# Patient Record
Sex: Male | Born: 2000 | Race: Black or African American | Hispanic: No | Marital: Single | Smoking: Never smoker
Health system: Southern US, Community
[De-identification: ages and names within clinical notes are randomized; demographics above are authoritative.]

## PROBLEM LIST (undated history)

## (undated) DIAGNOSIS — D573 Sickle-cell trait: Secondary | ICD-10-CM

---
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
## 2019-01-30 ENCOUNTER — Other Ambulatory Visit: Payer: Self-pay

## 2019-01-30 ENCOUNTER — Ambulatory Visit
Admission: EM | Admit: 2019-01-30 | Discharge: 2019-01-30 | Disposition: A | Discharge status: Home / Self Care | Payer: Managed Care, Other (non HMO) | Attending: Physician Assistant | Admitting: Physician Assistant

## 2019-01-30 ENCOUNTER — Encounter: Payer: Self-pay | Admitting: Physician Assistant

## 2019-01-30 DIAGNOSIS — B349 Viral infection, unspecified: Secondary | ICD-10-CM

## 2019-01-30 MED ORDER — IPRATROPIUM BROMIDE 0.06 % NA SOLN: 2.0000 | Freq: Four times a day (QID) | NASAL | 12 refills | Status: AC

## 2019-01-30 MED ORDER — FLUTICASONE PROPIONATE 50 MCG/ACT NA SUSP: 2.0000 | Freq: Every day | NASAL | 0 refills | Status: AC

## 2019-01-30 MED ORDER — CETIRIZINE HCL 10 MG PO TABS: 10.0000 mg | ORAL_TABLET | Freq: Every day | ORAL | 0 refills | Status: AC

## 2019-01-30 NOTE — Discharge Instructions (Addendum)
Zyrtec as directed. Start flonase, atrovent nasal spray for nasal congestion/drainage. You can use over the counter nasal saline rinse such as neti pot for nasal congestion. Keep hydrated, your urine should be clear to pale yellow in color. Tylenol/motrin for fever and pain. If you start developing shortness of breath, please call the COVID hotline for further instructions.

## 2019-01-30 NOTE — ED Triage Notes (Addendum)
Per pt he has been having congestion, fatigue, chills, some coughing for 2 days. No fevers, no sob, no chest pain. Feeling very tired and no apatite.

## 2019-01-30 NOTE — ED Provider Notes (Signed)
[facility]    CSN: [id number] Arrival date & time: 01/30/19  1516     History   Chief Complaint Chief Complaint  Patient presents with  . Nasal Congestion  . Fatigue    HPI Benjamin Kramer is a 18 y.o. male.   18 year old male comes in for 2 day history of URI symptoms.  Started with sore throat, now with rhinorrhea, nasal congestion, sneezing as well.  He has had minimal cough.  Denies fever, chills, night sweats.  Has felt more tired with no appetite.  Denies trouble swallowing, trouble breathing, swelling of the throat, tripoding, drooling, trismus.  Denies shortness of breath, wheezing, chest pain.  No obvious sick contact.  Denies any travels.  Has not taken anything for the symptoms.     Past Medical History:  Diagnosis Date  . Sickle cell trait (HCC)     There are no active problems to display for this patient.   History reviewed. No pertinent surgical history.     Home Medications    Prior to Admission medications   Medication Sig Start Date End Date Taking? Authorizing Provider  cetirizine (ZYRTEC) 10 MG tablet Take 1 tablet (10 mg total) by mouth daily. 01/30/19   Cathie Hoops, Phu Record V, PA-C  fluticasone (FLONASE) 50 MCG/ACT nasal spray Place 2 sprays into both nostrils daily. 01/30/19   Cathie Hoops, Hildred Pharo V, PA-C  ipratropium (ATROVENT) 0.06 % nasal spray Place 2 sprays into both nostrils 4 (four) times daily. 01/30/19   Belinda Fisher, PA-C    Family History No family history on file.  Social History Social History   Tobacco Use  . Smoking status: Former Games developer  . Smokeless tobacco: Never Used  Substance Use Topics  . Alcohol use: No  . Drug use: Not on file     Allergies   Patient has no known allergies.   Review of Systems Review of Systems  Reason unable to perform ROS: See HPI as above.     Physical Exam Triage Vital Signs ED Triage Vitals  Enc Vitals Group     BP 01/30/19 1525 129/77     Pulse Rate 01/30/19 1525 63     Resp 01/30/19 1525 16     Temp 01/30/19 1525 97.6 F (36.4 C)     Temp Source 01/30/19 1525 Oral     SpO2 01/30/19 1525 98 %     Weight 01/30/19 1526 175 lb (79.4 kg)     Height 01/30/19 1526 5\' 6"  (1.676 m)     Head Circumference --      Peak Flow --      Pain Score 01/30/19 1526 0     Pain Loc --      Pain Edu? --      Excl. in GC? --    No data found.  Updated Vital Signs BP 129/77 (BP Location: Right Arm)   Pulse 63   Temp 97.6 F (36.4 C) (Oral)   Resp 16   Ht 5\' 6"  (1.676 m)   Wt 175 lb (79.4 kg)   SpO2 98%   BMI 28.25 kg/m    Physical Exam Constitutional:      General: He is not in acute distress.    Appearance: He is well-developed. He is not ill-appearing, toxic-appearing or diaphoretic.  HENT:     Head: Normocephalic and atraumatic.     Right Ear: Tympanic membrane, ear canal and external ear normal. Tympanic membrane is not erythematous or bulging.  Left Ear: Tympanic membrane, ear canal and external ear normal. Tympanic membrane is not erythematous or bulging.     Nose: Rhinorrhea present.     Right Sinus: No maxillary sinus tenderness or frontal sinus tenderness.     Left Sinus: No maxillary sinus tenderness or frontal sinus tenderness.     Mouth/Throat:     Mouth: Mucous membranes are moist.     Pharynx: Oropharynx is clear. Uvula midline.  Eyes:     Conjunctiva/sclera: Conjunctivae normal.     Pupils: Pupils are equal, round, and reactive to light.  Neck:     Musculoskeletal: Normal range of motion and neck supple.  Cardiovascular:     Rate and Rhythm: Normal rate and regular rhythm.     Heart sounds: Normal heart sounds. No murmur. No friction rub. No gallop.   Pulmonary:     Effort: Pulmonary effort is normal. No accessory muscle usage, prolonged expiration, respiratory distress or retractions.     Breath sounds: Normal breath sounds. No stridor, decreased air movement or transmitted upper airway sounds. No decreased breath sounds, wheezing, rhonchi or rales.  Skin:     General: Skin is warm and dry.  Neurological:     Mental Status: He is alert and oriented to person, place, and time.      UC Treatments / Results  Labs (all labs ordered are listed, but only abnormal results are displayed) Labs Reviewed - No data to display  EKG None  Radiology No results found.  Procedures Procedures (including critical care time)  Medications Ordered in UC Medications - No data to display  Initial Impression / Assessment and Plan / UC Course  I have reviewed the triage vital signs and the nursing notes.  Pertinent labs & imaging results that were available during my care of the patient were reviewed by me and considered in my medical decision making (see chart for details).    Discussed allergic rhinitis versus viral illness.  Discussed unable to do cover testing, lower suspicion given no travels, rhinorrhea/nasal congestion.  However, will provide self quarantine information, and have patient take precautions.  Symptomatic treatment discussed.  Push fluids.  Return precautions given.  Patient expresses understanding and agrees to plan.  Final Clinical Impressions(s) / UC Diagnoses   Final diagnoses:  Viral illness   ED Prescriptions    Medication Sig Dispense Auth. Provider   ipratropium (ATROVENT) 0.06 % nasal spray Place 2 sprays into both nostrils 4 (four) times daily. 15 mL Danni Leabo V, PA-C   fluticasone (FLONASE) 50 MCG/ACT nasal spray Place 2 sprays into both nostrils daily. 1 g Marysue Fait V, PA-C   cetirizine (ZYRTEC) 10 MG tablet Take 1 tablet (10 mg total) by mouth daily. 30 tablet Threasa Alpha, [place] 01/30/19 1547

## 2020-01-07 ENCOUNTER — Telehealth: Payer: Self-pay | Admitting: General Practice

## 2020-01-07 NOTE — Telephone Encounter (Signed)
Requesting to become a new patient of yours.  You see his mother Maryla Morrow. She thinks you would be great with him.   Please advise.

## 2020-01-08 NOTE — Telephone Encounter (Signed)
I can accept

## 2020-01-11 NOTE — Telephone Encounter (Signed)
Patient has made an appointment for 3/10. New patient paperwork has been mailed out.

## 2020-01-18 NOTE — Progress Notes (Signed)
Subjective:    Patient ID: Benjamin Kramer, male    DOB: 01/24/2001, 19 y.o.   MRN: [id number]  HPI  He is here to establish with a new pcp.   .   he is here for a physical exam.   He has no concerns or questions.  Medications and allergies reviewed with patient and updated if appropriate.  Patient Active Problem List   Diagnosis Date Noted  . Allergic rhinitis 01/19/2020    Current Outpatient Medications on File Prior to Visit  Medication Sig Dispense Refill  . Ascorbic Acid (VITAMIN C) 500 MG CAPS Take 500 mg by mouth.    . cetirizine (ZYRTEC) 10 MG tablet Take 1 tablet (10 mg total) by mouth daily. 30 tablet 0  . fluticasone (FLONASE) 50 MCG/ACT nasal spray Place 2 sprays into both nostrils daily. 1 g 0  . ipratropium (ATROVENT) 0.06 % nasal spray Place 2 sprays into both nostrils 4 (four) times daily. 15 mL 12   No current facility-administered medications on file prior to visit.    Past Medical History:  Diagnosis Date  . Sickle cell trait (HCC)     History reviewed. No pertinent surgical history.  Social History   Socioeconomic History  . Marital status: Single    Spouse name: Not on file  . Number of children: Not on file  . Years of education: Not on file  . Highest education level: Not on file  Occupational History  . Not on file  Tobacco Use  . Smoking status: Former Games developer  . Smokeless tobacco: Never Used  Substance and Sexual Activity  . Alcohol use: No  . Drug use: Never  . Sexual activity: Yes  Other Topics Concern  . Not on file  Social History Narrative  . Not on file   Social Determinants of Health   Financial Resource Strain:   . Difficulty of Paying Living Expenses: Not on file  Food Insecurity:   . Worried About Programme researcher, broadcasting/film/video in the Last Year: Not on file  . Ran Out of Food in the Last Year: Not on file  Transportation Needs:   . Lack of Transportation (Medical): Not on file  . Lack of Transportation (Non-Medical): Not on file    Physical Activity:   . Days of Exercise per Week: Not on file  . Minutes of Exercise per Session: Not on file  Stress:   . Feeling of Stress : Not on file  Social Connections:   . Frequency of Communication with Friends and Family: Not on file  . Frequency of Social Gatherings with Friends and Family: Not on file  . Attends Religious Services: Not on file  . Active Member of Clubs or Organizations: Not on file  . Attends Banker Meetings: Not on file  . Marital Status: Not on file    Family History  Problem Relation Age of Onset  . Hypertension Maternal Grandmother     Review of Systems  Constitutional: Negative for chills and fever.  Eyes: Negative for visual disturbance.  Respiratory: Negative for cough, shortness of breath and wheezing.   Cardiovascular: Negative for chest pain, palpitations and leg swelling.  Gastrointestinal: Negative for abdominal pain, blood in stool, constipation, diarrhea and nausea.  Genitourinary: Negative for difficulty urinating, discharge, dysuria and hematuria.  Musculoskeletal: Positive for back pain. Negative for arthralgias.  Skin: Negative for color change and rash.  Neurological: Negative for dizziness, light-headedness and headaches.  Psychiatric/Behavioral: Negative for  dysphoric mood. The patient is not nervous/anxious.        Objective:   Vitals:   01/19/20 1015  BP: 122/84  Pulse: 61  Resp: 16  Temp: 98.4 F (36.9 C)  SpO2: 99%   Filed Weights   01/19/20 1015  Weight: 187 lb (84.8 kg)   Body mass index is 30.18 kg/m.  BP Readings from Last 3 Encounters:  01/19/20 122/84  01/30/19 129/77  11/01/16 123/71 (72 %, Z = 0.60 /  64 %, Z = 0.36)*   *BP percentiles are based on the 2017 AAP Clinical Practice Guideline for boys    Wt Readings from Last 3 Encounters:  01/19/20 187 lb (84.8 kg)  01/30/19 175 lb (79.4 kg)  11/01/16 178 lb (80.7 kg) (86 %, Z= 1.06)*   * Growth percentiles are based on CDC  (Boys, 2-20 Years) data.     Physical Exam Constitutional: He appears well-developed and well-nourished. No distress.  HENT:  Head: Normocephalic and atraumatic.  Right Ear: External ear normal.  Left Ear: External ear normal.  Mouth/Throat: Oropharynx is clear and moist.  Normal ear canals and TM b/l  Eyes: Conjunctivae and EOM are normal.  Neck: Neck supple. No tracheal deviation present. No thyromegaly present.  No carotid bruit  Cardiovascular: Normal rate, regular rhythm, normal heart sounds and intact distal pulses.   No murmur heard. Pulmonary/Chest: Effort normal and breath sounds normal. No respiratory distress. He has no wheezes. He has no rales.  Abdominal: Soft. He exhibits no distension. There is no tenderness.  Genitourinary: deferred  Musculoskeletal: He exhibits no edema.  Lymphadenopathy:   He has no cervical adenopathy.  Skin: Skin is warm and dry. He is not diaphoretic.  Psychiatric: He has a normal mood and affect. His behavior is normal.          Assessment & Plan:   Physical exam: Screening blood work    deferred for today Immunizations tetanus due this year-given today Exercise works at [employer] and has a very physical job Weight normal Substance abuse none  See Problem List for Assessment and Plan of chronic medical problems.      This visit occurred during the SARS-CoV-2 public health emergency.  Safety protocols were in place, including screening questions prior to the visit, additional usage of staff PPE, and extensive cleaning of exam room while observing appropriate contact time as indicated for disinfecting solutions.

## 2020-01-18 NOTE — Patient Instructions (Addendum)
All other Health Maintenance issues reviewed.   All recommended immunizations and age-appropriate screenings are up-to-date or discussed.  Tetanus immunization administered today.   Medications reviewed and updated.  Changes include :   none    Please followup in 1 year    Health Maintenance, Male Adopting a healthy lifestyle and getting preventive care are important in promoting health and wellness. Ask your health care provider about:  The right schedule for you to have regular tests and exams.  Things you can do on your own to prevent diseases and keep yourself healthy. What should I know about diet, weight, and exercise? Eat a healthy diet   Eat a diet that includes plenty of vegetables, fruits, low-fat dairy products, and lean protein.  Do not eat a lot of foods that are high in solid fats, added sugars, or sodium. Maintain a healthy weight Body mass index (BMI) is a measurement that can be used to identify possible weight problems. It estimates body fat based on height and weight. Your health care provider can help determine your BMI and help you achieve or maintain a healthy weight. Get regular exercise Get regular exercise. This is one of the most important things you can do for your health. Most adults should:  Exercise for at least 150 minutes each week. The exercise should increase your heart rate and make you sweat (moderate-intensity exercise).  Do strengthening exercises at least twice a week. This is in addition to the moderate-intensity exercise.  Spend less time sitting. Even light physical activity can be beneficial. Watch cholesterol and blood lipids Have your blood tested for lipids and cholesterol at 21 years of age, then have this test every 5 years. You may need to have your cholesterol levels checked more often if:  Your lipid or cholesterol levels are high.  You are older than 21 years of age.  You are at high risk for heart disease. What should  I know about cancer screening? Many types of cancers can be detected early and may often be prevented. Depending on your health history and family history, you may need to have cancer screening at various ages. This may include screening for:  Colorectal cancer.  Prostate cancer.  Skin cancer.  Lung cancer. What should I know about heart disease, diabetes, and high blood pressure? Blood pressure and heart disease  High blood pressure causes heart disease and increases the risk of stroke. This is more likely to develop in people who have high blood pressure readings, are of African descent, or are overweight.  Talk with your health care provider about your target blood pressure readings.  Have your blood pressure checked: ? Every 3-5 years if you are 31-3 years of age. ? Every year if you are 42 years old or older.  If you are between the ages of 65 and 18 and are a current or former smoker, ask your health care provider if you should have a one-time screening for abdominal aortic aneurysm (AAA). Diabetes Have regular diabetes screenings. This checks your fasting blood sugar level. Have the screening done:  Once every three years after age 85 if you are at a normal weight and have a low risk for diabetes.  More often and at a younger age if you are overweight or have a high risk for diabetes. What should I know about preventing infection? Hepatitis B If you have a higher risk for hepatitis B, you should be screened for this virus. Talk with your health care  provider to find out if you are at risk for hepatitis B infection. Hepatitis C Blood testing is recommended for:  Everyone born from [birth year] through 1965.  Anyone with known risk factors for hepatitis C. Sexually transmitted infections (STIs)  You should be screened each year for STIs, including gonorrhea and chlamydia, if: ? You are sexually active and are younger than 21 years of age. ? You are older than 21 years of age  and your health care provider tells you that you are at risk for this type of infection. ? Your sexual activity has changed since you were last screened, and you are at increased risk for chlamydia or gonorrhea. Ask your health care provider if you are at risk.  Ask your health care provider about whether you are at high risk for HIV. Your health care provider may recommend a prescription medicine to help prevent HIV infection. If you choose to take medicine to prevent HIV, you should first get tested for HIV. You should then be tested every 3 months for as long as you are taking the medicine. Follow these instructions at home: Lifestyle  Do not use any products that contain nicotine or tobacco, such as cigarettes, e-cigarettes, and chewing tobacco. If you need help quitting, ask your health care provider.  Do not use street drugs.  Do not share needles.  Ask your health care provider for help if you need support or information about quitting drugs. Alcohol use  Do not drink alcohol if your health care provider tells you not to drink.  If you drink alcohol: ? Limit how much you have to 0-2 drinks a day. ? Be aware of how much alcohol is in your drink. In the [place] one drink equals one 12 oz bottle of beer (355 mL), one 5 oz glass of wine (148 mL), or one 1 oz glass of hard liquor (44 mL). General instructions  Schedule regular health, dental, and eye exams.  Stay current with your vaccines.  Tell your health care provider if: ? You often feel depressed. ? You have ever been abused or do not feel safe at home. Summary  Adopting a healthy lifestyle and getting preventive care are important in promoting health and wellness.  Follow your health care provider's instructions about healthy diet, exercising, and getting tested or screened for diseases.  Follow your health care provider's instructions on monitoring your cholesterol and blood pressure. This information is not intended to  replace advice given to you by your health care provider. Make sure you discuss any questions you have with your health care provider. Document Revised: 10/21/2018 Document Reviewed: 10/21/2018 Elsevier Patient Education  2020 [employer].

## 2020-01-19 ENCOUNTER — Other Ambulatory Visit: Payer: Self-pay

## 2020-01-19 ENCOUNTER — Encounter: Payer: Self-pay | Admitting: Internal Medicine

## 2020-01-19 ENCOUNTER — Ambulatory Visit: Payer: Managed Care, Other (non HMO) | Admitting: Internal Medicine

## 2020-01-19 VITALS — BP 122/84 | HR 61 | Temp 98.4°F | Resp 16 | Ht 66.0 in | Wt 187.0 lb

## 2020-01-19 DIAGNOSIS — Z0000 Encounter for general adult medical examination without abnormal findings: Secondary | ICD-10-CM | POA: Diagnosis not present

## 2020-01-19 DIAGNOSIS — J302 Other seasonal allergic rhinitis: Secondary | ICD-10-CM

## 2020-01-19 DIAGNOSIS — Z23 Encounter for immunization: Secondary | ICD-10-CM

## 2020-01-19 DIAGNOSIS — J309 Allergic rhinitis, unspecified: Secondary | ICD-10-CM | POA: Insufficient documentation

## 2020-01-19 NOTE — Assessment & Plan Note (Signed)
Seasonal Mild Takes medication-over-the-counter, only as needed Controlled

## 2020-01-19 NOTE — Addendum Note (Signed)
Addended by: Mercer Pod E on: 01/19/2020 01:37 PM   Modules accepted: Orders

## 2023-03-26 ENCOUNTER — Ambulatory Visit (INDEPENDENT_AMBULATORY_CARE_PROVIDER_SITE_OTHER): Payer: Managed Care, Other (non HMO)

## 2023-03-26 ENCOUNTER — Ambulatory Visit
Admission: EM | Admit: 2023-03-26 | Discharge: 2023-03-26 | Disposition: A | Discharge status: Home / Self Care | Payer: Managed Care, Other (non HMO) | Attending: Urgent Care | Admitting: Urgent Care

## 2023-03-26 DIAGNOSIS — S60052A Contusion of left little finger without damage to nail, initial encounter: Secondary | ICD-10-CM

## 2023-03-26 MED ORDER — IBUPROFEN 600 MG PO TABS: 600.0000 mg | ORAL_TABLET | Freq: Four times a day (QID) | ORAL | 0 refills | Status: AC | PRN

## 2023-03-26 NOTE — ED Triage Notes (Signed)
Pt reports injury to left pinky finger while boxing ~1.5 months ago-has not sought medical tx PTA-NAD-steady gait

## 2023-03-26 NOTE — ED Provider Notes (Signed)
[name]  Note:  This document was prepared using Conservation officer, historic buildings and may include unintentional dictation errors.  MRN: [id number] DOB: 2001-01-26  Subjective:   Benjamin Kramer is a 22 y.o. male presenting for 1.67-month history of persistent left pinky finger and left hand pain.  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  Has continued to do his training.  No bruising, bony deformity.  However he does feel like there is swelling at the left fifth knuckle.  No current facility-administered medications for this encounter.  Current Outpatient Medications:    Ascorbic Acid (VITAMIN C) 500 MG CAPS, Take 500 mg by mouth., Disp: , Rfl:    cetirizine (ZYRTEC) 10 MG tablet, Take 1 tablet (10 mg total) by mouth daily., Disp: 30 tablet, Rfl: 0   fluticasone (FLONASE) 50 MCG/ACT nasal spray, Place 2 sprays into both nostrils daily., Disp: 1 g, Rfl: 0   ipratropium (ATROVENT) 0.06 % nasal spray, Place 2 sprays into both nostrils 4 (four) times daily., Disp: 15 mL, Rfl: 12   No Known Allergies  Past Medical History:  Diagnosis Date   Sickle cell trait (HCC)      History reviewed. No pertinent surgical history.  Family History  Problem Relation Age of Onset   Hypertension Maternal Grandmother     Social History   Tobacco Use   Smoking status: Never   Smokeless tobacco: Never  Vaping Use   Vaping Use: Never used  Substance Use Topics   Alcohol use: Yes    Comment: occ   Drug use: Yes    Types: Marijuana    ROS   Objective:   Vitals: BP (!) 146/80 (BP Location: Right Arm)   Pulse 72   Temp 98.9 F (37.2 C) (Oral)   Resp 16   SpO2 96%   Physical Exam Constitutional:      General: He is not in acute distress.    Appearance: Normal appearance. He is well-developed and normal weight. He is not ill-appearing, toxic-appearing or diaphoretic.  HENT:     Head: Normocephalic and atraumatic.     Right Ear:  External ear normal.     Left Ear: External ear normal.     Nose: Nose normal.     Mouth/Throat:     Pharynx: Oropharynx is clear.  Eyes:     General: No scleral icterus.       Right eye: No discharge.        Left eye: No discharge.     Extraocular Movements: Extraocular movements intact.  Cardiovascular:     Rate and Rhythm: Normal rate.  Pulmonary:     Effort: Pulmonary effort is normal.  Musculoskeletal:       Hands:     Cervical back: Normal range of motion.  Neurological:     Mental Status: He is alert and oriented to person, place, and time.  Psychiatric:        Mood and Affect: Mood normal.        Behavior: Behavior normal.        Thought Content: Thought content normal.        Judgment: Judgment normal.    DG Hand Complete Left  Result Date: 03/26/2023 CLINICAL DATA:  Pain EXAM: LEFT HAND - COMPLETE 3+ VIEW COMPARISON:  None Available. FINDINGS: No fracture or dislocation is seen. There are no focal lytic lesions. Small bony spurs are noted in the intercarpal joints  along the lateral aspect of the wrist. There are no abnormal soft tissue calcifications or opaque foreign bodies. IMPRESSION: No recent fracture or dislocation is seen in left hand. Small bony spurs are noted in the intercarpal joints along the lateral aspect of left wrist. Electronically Signed   By: Ernie Avena M.D.   On: 03/26/2023 19:42     Assessment and Plan :   PDMP not reviewed this encounter.  1. Contusion of left little finger without damage to nail, initial encounter    Recommended conservative management for left fifth finger contusion.  Use buddy tape system, rest, ibuprofen for pain and inflammation. Counseled patient on potential for adverse effects with medications prescribed/recommended today, ER and return-to-clinic precautions discussed, patient verbalized understanding.    Wallis Bamberg, [place] 03/27/23 [phone]

## 2023-06-18 ENCOUNTER — Encounter: Payer: Self-pay | Admitting: Internal Medicine

## 2023-06-18 NOTE — Progress Notes (Unsigned)
Subjective:    Patient ID: Benjamin Kramer, male    DOB: July 13, 2001, 22 y.o.   MRN: [id number]      HPI Benjamin Kramer is here for a Physical exam and her chronic medical problems.      Medications and allergies reviewed with patient and updated if appropriate.  Current Outpatient Medications on File Prior to Visit  Medication Sig Dispense Refill   Ascorbic Acid (VITAMIN C) 500 MG CAPS Take 500 mg by mouth.     cetirizine (ZYRTEC) 10 MG tablet Take 1 tablet (10 mg total) by mouth daily. 30 tablet 0   fluticasone (FLONASE) 50 MCG/ACT nasal spray Place 2 sprays into both nostrils daily. 1 g 0   ibuprofen (ADVIL) 600 MG tablet Take 1 tablet (600 mg total) by mouth every 6 (six) hours as needed. 30 tablet 0   ipratropium (ATROVENT) 0.06 % nasal spray Place 2 sprays into both nostrils 4 (four) times daily. 15 mL 12   No current facility-administered medications on file prior to visit.    Review of Systems     Objective:  There were no vitals filed for this visit. There were no vitals filed for this visit. There is no height or weight on file to calculate BMI.  BP Readings from Last 3 Encounters:  03/26/23 (!) 146/80  01/19/20 122/84  01/30/19 129/77    Wt Readings from Last 3 Encounters:  01/19/20 187 lb (84.8 kg)  01/30/19 175 lb (79.4 kg)  11/01/16 178 lb (80.7 kg) (86%, Z= 1.06)*   * Growth percentiles are based on CDC (Boys, 2-20 Years) data.       Physical Exam Constitutional: She appears well-developed and well-nourished. No distress.  HENT:  Head: Normocephalic and atraumatic.  Right Ear: External ear normal. Normal ear canal and TM Left Ear: External ear normal.  Normal ear canal and TM Mouth/Throat: Oropharynx is clear and moist.  Eyes: Conjunctivae normal.  Neck: Neck supple. No tracheal deviation present. No thyromegaly present.  No carotid bruit  Cardiovascular: Normal rate, regular rhythm and normal heart sounds.   No murmur heard.  No  edema. Pulmonary/Chest: Effort normal and breath sounds normal. No respiratory distress. She has no wheezes. She has no rales.  Breast: deferred   Abdominal: Soft. She exhibits no distension. There is no tenderness.  Lymphadenopathy: She has no cervical adenopathy.  Skin: Skin is warm and dry. She is not diaphoretic.  Psychiatric: She has a normal mood and affect. Her behavior is normal.     Lab Results  Component Value Date   WBC 3.6 (L) 11/01/2016   HGB 15.6 11/01/2016   HCT 42.4 11/01/2016   PLT 201 11/01/2016   GLUCOSE 95 11/01/2016   ALT 15 (L) 11/01/2016   AST 20 11/01/2016   NA 138 11/01/2016   K 3.9 11/01/2016   CL 102 11/01/2016   CREATININE 1.39 (H) 11/01/2016   BUN 12 11/01/2016   CO2 28 11/01/2016         Assessment & Plan:   Physical exam: Screening blood work  ordered Exercise   Weight   Substance abuse  none   Reviewed recommended immunizations.   Health Maintenance  Topic Date Due   HPV VACCINES (1 - Male 3-dose series) Never done   HIV Screening  Never done   Hepatitis C Screening  Never done   COVID-19 Vaccine (1 - 2023-24 season) Never done   INFLUENZA VACCINE  06/12/2023   DTaP/Tdap/Td (3 - Td  or Tdap) 01/18/2030          See Problem List for Assessment and Plan of chronic medical problems.

## 2023-06-18 NOTE — Patient Instructions (Addendum)
Look to see if you had the HPV vaccine    Blood work was ordered.   The lab is on the first floor.    Medications changes include :   none     Return in about 1 year (around 06/18/2024) for Physical Exam.    Health Maintenance, Male Adopting a healthy lifestyle and getting preventive care are important in promoting health and wellness. Ask your health care provider about: The right schedule for you to have regular tests and exams. Things you can do on your own to prevent diseases and keep yourself healthy. What should I know about diet, weight, and exercise? Eat a healthy diet  Eat a diet that includes plenty of vegetables, fruits, low-fat dairy products, and lean protein. Do not eat a lot of foods that are high in solid fats, added sugars, or sodium. Maintain a healthy weight Body mass index (BMI) is used to identify weight problems. It estimates body fat based on height and weight. Your health care provider can help determine your BMI and help you achieve or maintain a healthy weight. Get regular exercise Get regular exercise. This is one of the most important things you can do for your health. Most adults should: Exercise for at least 150 minutes each week. The exercise should increase your heart rate and make you sweat (moderate-intensity exercise). Do strengthening exercises at least twice a week. This is in addition to the moderate-intensity exercise. Spend less time sitting. Even light physical activity can be beneficial. Watch cholesterol and blood lipids Have your blood tested for lipids and cholesterol at 24 years of age, then have this test every 5 years. Have your cholesterol levels checked more often if: Your lipid or cholesterol levels are high. You are older than 24 years of age. You are at high risk for heart disease. What should I know about cancer screening? Depending on your health history and family history, you may need to have cancer screening at  various ages. This may include screening for: Breast cancer. Cervical cancer. Colorectal cancer. Skin cancer. Lung cancer. What should I know about heart disease, diabetes, and high blood pressure? Blood pressure and heart disease High blood pressure causes heart disease and increases the risk of stroke. This is more likely to develop in people who have high blood pressure readings or are overweight. Have your blood pressure checked: Every 3-5 years if you are 75-34 years of age. Every year if you are 40 years old or older. Diabetes Have regular diabetes screenings. This checks your fasting blood sugar level. Have the screening done: Once every three years after age 80 if you are at a normal weight and have a low risk for diabetes. More often and at a younger age if you are overweight or have a high risk for diabetes. What should I know about preventing infection? Hepatitis B If you have a higher risk for hepatitis B, you should be screened for this virus. Talk with your health care provider to find out if you are at risk for hepatitis B infection. Hepatitis C Testing is recommended for: Everyone born from [birth year] through 1965. Anyone with known risk factors for hepatitis C. Sexually transmitted infections (STIs) Get screened for STIs, including gonorrhea and chlamydia, if: You are sexually active and are younger than 24 years of age. You are older than 24 years of age and your health care provider tells you that you are at risk for this type of infection. Your sexual activity has  changed since you were last screened, and you are at increased risk for chlamydia or gonorrhea. Ask your health care provider if you are at risk. Ask your health care provider about whether you are at high risk for HIV. Your health care provider may recommend a prescription medicine to help prevent HIV infection. If you choose to take medicine to prevent HIV, you should first get tested for HIV. You should then be  tested every 3 months for as long as you are taking the medicine. Pregnancy If you are about to stop having your period (premenopausal) and you may become pregnant, seek counseling before you get pregnant. Take 400 to 800 micrograms (mcg) of folic acid every day if you become pregnant. Ask for birth control (contraception) if you want to prevent pregnancy. Osteoporosis and menopause Osteoporosis is a disease in which the bones lose minerals and strength with aging. This can result in bone fractures. If you are 12 years old or older, or if you are at risk for osteoporosis and fractures, ask your health care provider if you should: Be screened for bone loss. Take a calcium or vitamin D supplement to lower your risk of fractures. Be given hormone replacement therapy (HRT) to treat symptoms of menopause. Follow these instructions at home: Alcohol use Do not drink alcohol if: Your health care provider tells you not to drink. You are pregnant, may be pregnant, or are planning to become pregnant. If you drink alcohol: Limit how much you have to: 0-1 drink a day. Know how much alcohol is in your drink. In the [place] one drink equals one 12 oz bottle of beer (355 mL), one 5 oz glass of wine (148 mL), or one 1 oz glass of hard liquor (44 mL). Lifestyle Do not use any products that contain nicotine or tobacco. These products include cigarettes, chewing tobacco, and vaping devices, such as e-cigarettes. If you need help quitting, ask your health care provider. Do not use street drugs. Do not share needles. Ask your health care provider for help if you need support or information about quitting drugs. General instructions Schedule regular health, dental, and eye exams. Stay current with your vaccines. Tell your health care provider if: You often feel depressed. You have ever been abused or do not feel safe at home. Summary Adopting a healthy lifestyle and getting preventive care are important in  promoting health and wellness. Follow your health care provider's instructions about healthy diet, exercising, and getting tested or screened for diseases. Follow your health care provider's instructions on monitoring your cholesterol and blood pressure. This information is not intended to replace advice given to you by your health care provider. Make sure you discuss any questions you have with your health care provider. Document Revised: 03/19/2021 Document Reviewed: 03/19/2021 Elsevier Patient Education  2024 [employer].

## 2023-06-19 ENCOUNTER — Ambulatory Visit (INDEPENDENT_AMBULATORY_CARE_PROVIDER_SITE_OTHER): Payer: Managed Care, Other (non HMO) | Admitting: Internal Medicine

## 2023-06-19 VITALS — BP 110/60 | HR 56 | Temp 98.6°F | Ht 66.0 in | Wt 175.0 lb

## 2023-06-19 DIAGNOSIS — J302 Other seasonal allergic rhinitis: Secondary | ICD-10-CM | POA: Diagnosis not present

## 2023-06-19 DIAGNOSIS — Z113 Encounter for screening for infections with a predominantly sexual mode of transmission: Secondary | ICD-10-CM

## 2023-06-19 DIAGNOSIS — Z0000 Encounter for general adult medical examination without abnormal findings: Secondary | ICD-10-CM | POA: Diagnosis not present

## 2023-06-19 DIAGNOSIS — D72819 Decreased white blood cell count, unspecified: Secondary | ICD-10-CM

## 2023-06-19 NOTE — Progress Notes (Signed)
Subjective:    Patient ID: Benjamin Kramer, male    DOB: 23-Oct-2001, 22 y.o.   MRN: [id number]     HPI Benjamin Kramer is here for a physical exam and his chronic medical problems.   Occ severe left abdominal muscle spasms - usually when straining core.  Cannot occur during the intense workout or just afterward or doing something where he is straining his abdomen muscles.  He has not had this for a few weeks.  One of his pinky toes - since he was a kids - ? From wearing cleats.    Medications and allergies reviewed with patient and updated if appropriate.  Current Outpatient Medications on File Prior to Visit  Medication Sig Dispense Refill   Ascorbic Acid (VITAMIN C) 500 MG CAPS Take 500 mg by mouth.     cetirizine (ZYRTEC) 10 MG tablet Take 1 tablet (10 mg total) by mouth daily. 30 tablet 0   fluticasone (FLONASE) 50 MCG/ACT nasal spray Place 2 sprays into both nostrils daily. 1 g 0   ibuprofen (ADVIL) 600 MG tablet Take 1 tablet (600 mg total) by mouth every 6 (six) hours as needed. 30 tablet 0   ipratropium (ATROVENT) 0.06 % nasal spray Place 2 sprays into both nostrils 4 (four) times daily. 15 mL 12   No current facility-administered medications on file prior to visit.    Review of Systems  Constitutional:  Negative for fever.  Eyes:  Negative for visual disturbance.  Respiratory:  Negative for cough, shortness of breath and wheezing.   Cardiovascular:  Negative for chest pain, palpitations and leg swelling.  Gastrointestinal:  Negative for abdominal pain, blood in stool, constipation and diarrhea.       No gerd  Genitourinary:  Negative for difficulty urinating and dysuria.  Musculoskeletal:  Negative for arthralgias and back pain.  Skin:  Negative for rash.  Neurological:  Negative for light-headedness and headaches.  Psychiatric/Behavioral:  Negative for dysphoric mood. The patient is not nervous/anxious.        Objective:   Vitals:   06/19/23 0913  BP: 110/60  Pulse:  (!) 56  Temp: 98.6 F (37 C)  SpO2: 99%   Filed Weights   06/19/23 0913  Weight: 175 lb (79.4 kg)   Body mass index is 28.25 kg/m.  BP Readings from Last 3 Encounters:  06/19/23 110/60  03/26/23 (!) 146/80  01/19/20 122/84    Wt Readings from Last 3 Encounters:  06/19/23 175 lb (79.4 kg)  01/19/20 187 lb (84.8 kg)  01/30/19 175 lb (79.4 kg)      Physical Exam Constitutional: He appears well-developed and well-nourished. No distress.  HENT:  Head: Normocephalic and atraumatic.  Right Ear: External ear normal.  Left Ear: External ear normal.  Normal ear canals and TM b/l  Mouth/Throat: Oropharynx is clear and moist. Eyes: Conjunctivae and EOM are normal.  Neck: Neck supple. No tracheal deviation present. No thyromegaly present.  No carotid bruit  Cardiovascular: Normal rate, regular rhythm, normal heart sounds and intact distal pulses.   No murmur heard.  No lower extremity edema. Pulmonary/Chest: Effort normal and breath sounds normal. No respiratory distress. He has no wheezes. He has no rales.  Abdominal: Soft. He exhibits no distension. There is no tenderness.  Genitourinary: deferred  Lymphadenopathy:   He has no cervical adenopathy.  Skin: Skin is warm and dry. He is not diaphoretic.  Psychiatric: He has a normal mood and affect. His behavior is normal.  Assessment & Plan:   Physical exam: Screening blood work  ordered Exercise   regular - gym, boxing Weight  good for build - muscular Substance abuse   none   Reviewed recommended immunizations.   Health Maintenance  Topic Date Due   HPV VACCINES (1 - Male 3-dose series) Never done   HIV Screening  Never done   Hepatitis C Screening  Never done   COVID-19 Vaccine (1 - 2023-24 season) Never done   INFLUENZA VACCINE  02/09/2024 (Originally 06/12/2023)   DTaP/Tdap/Td (3 - Td or Tdap) 01/18/2030   Will check to see if he had HPV vaccine with his mother  See Problem List for Assessment and  Plan of chronic medical problems.

## 2023-06-19 NOTE — Assessment & Plan Note (Signed)
Controlled Over-the-counter allergy medications as needed

## 2023-06-19 NOTE — Assessment & Plan Note (Signed)
Discussed screening for STDs-agreed Blood work ordered

## 2023-06-21 NOTE — Addendum Note (Signed)
Addended by: Pincus Sanes on: 06/21/2023 04:47 PM   Modules accepted: Orders

## 2023-06-26 ENCOUNTER — Other Ambulatory Visit: Payer: Managed Care, Other (non HMO)

## 2023-06-26 DIAGNOSIS — D72819 Decreased white blood cell count, unspecified: Secondary | ICD-10-CM | POA: Diagnosis not present

## 2023-06-26 LAB — CBC WITH DIFFERENTIAL/PLATELET
Basophils Absolute: 0 10*3/uL (ref 0.0–0.1)
Basophils Relative: 0.5 % (ref 0.0–3.0)
Eosinophils Absolute: 0 10*3/uL (ref 0.0–0.7)
Eosinophils Relative: 0.5 % (ref 0.0–5.0)
HCT: 46.6 % (ref 39.0–52.0)
Hemoglobin: 15.7 g/dL (ref 13.0–17.0)
Lymphocytes Relative: 36.9 % (ref 12.0–46.0)
Lymphs Abs: 1 10*3/uL (ref 0.7–4.0)
MCHC: 33.6 g/dL (ref 30.0–36.0)
MCV: 87.8 fl (ref 78.0–100.0)
Monocytes Absolute: 0.2 10*3/uL (ref 0.1–1.0)
Monocytes Relative: 8.7 % (ref 3.0–12.0)
Neutro Abs: 1.5 10*3/uL (ref 1.4–7.7)
Neutrophils Relative %: 53.4 % (ref 43.0–77.0)
Platelets: 174 10*3/uL (ref 150.0–400.0)
RBC: 5.31 Mil/uL (ref 4.22–5.81)
RDW: 13.5 % (ref 11.5–15.5)
WBC: 2.8 10*3/uL — ABNORMAL LOW (ref 4.0–10.5)

## 2023-08-28 ENCOUNTER — Ambulatory Visit: Payer: Managed Care, Other (non HMO) | Admitting: Podiatry

## 2024-11-22 ENCOUNTER — Encounter: Payer: Self-pay | Admitting: Internal Medicine

## 2024-11-22 NOTE — Patient Instructions (Addendum)
 "     Blood work was ordered.       Medications changes include :   None    A referral was ordered and someone will call you to schedule an appointment.     Return in about 1 year (around 11/23/2025) for Physical Exam.   Health Maintenance, Male Adopting a healthy lifestyle and getting preventive care are important in promoting health and wellness. Ask your health care provider about: The right schedule for you to have regular tests and exams. Things you can do on your own to prevent diseases and keep yourself healthy. What should I know about diet, weight, and exercise? Eat a healthy diet  Eat a diet that includes plenty of vegetables, fruits, low-fat dairy products, and lean protein. Do not eat a lot of foods that are high in solid fats, added sugars, or sodium. Maintain a healthy weight Body mass index (BMI) is a measurement that can be used to identify possible weight problems. It estimates body fat based on height and weight. Your health care provider can help determine your BMI and help you achieve or maintain a healthy weight. Get regular exercise Get regular exercise. This is one of the most important things you can do for your health. Most adults should: Exercise for at least 150 minutes each week. The exercise should increase your heart rate and make you sweat (moderate-intensity exercise). Do strengthening exercises at least twice a week. This is in addition to the moderate-intensity exercise. Spend less time sitting. Even light physical activity can be beneficial. Watch cholesterol and blood lipids Have your blood tested for lipids and cholesterol at 26 years of age, then have this test every 5 years. You may need to have your cholesterol levels checked more often if: Your lipid or cholesterol levels are high. You are older than 26 years of age. You are at high risk for heart disease. What should I know about cancer screening? Many types of cancers can be detected  early and may often be prevented. Depending on your health history and family history, you may need to have cancer screening at various ages. This may include screening for: Colorectal cancer. Prostate cancer. Skin cancer. Lung cancer. What should I know about heart disease, diabetes, and high blood pressure? Blood pressure and heart disease High blood pressure causes heart disease and increases the risk of stroke. This is more likely to develop in people who have high blood pressure readings or are overweight. Talk with your health care provider about your target blood pressure readings. Have your blood pressure checked: Every 3-5 years if you are 65-30 years of age. Every year if you are 65 years old or older. If you are between the ages of 73 and 32 and are a current or former smoker, ask your health care provider if you should have a one-time screening for abdominal aortic aneurysm (AAA). Diabetes Have regular diabetes screenings. This checks your fasting blood sugar level. Have the screening done: Once every three years after age 12 if you are at a normal weight and have a low risk for diabetes. More often and at a younger age if you are overweight or have a high risk for diabetes. What should I know about preventing infection? Hepatitis B If you have a higher risk for hepatitis B, you should be screened for this virus. Talk with your health care provider to find out if you are at risk for hepatitis B infection. Hepatitis C Blood testing is recommended  for: Everyone born from [birth year] through 1967. Anyone with known risk factors for hepatitis C. Sexually transmitted infections (STIs) You should be screened each year for STIs, including gonorrhea and chlamydia, if: You are sexually active and are younger than 26 years of age. You are older than 26 years of age and your health care provider tells you that you are at risk for this type of infection. Your sexual activity has changed since  you were last screened, and you are at increased risk for chlamydia or gonorrhea. Ask your health care provider if you are at risk. Ask your health care provider about whether you are at high risk for HIV. Your health care provider may recommend a prescription medicine to help prevent HIV infection. If you choose to take medicine to prevent HIV, you should first get tested for HIV. You should then be tested every 3 months for as long as you are taking the medicine. Follow these instructions at home: Alcohol use Do not drink alcohol if your health care provider tells you not to drink. If you drink alcohol: Limit how much you have to 0-2 drinks a day. Know how much alcohol is in your drink. In the [place] one drink equals one 12 oz bottle of beer (355 mL), one 5 oz glass of wine (148 mL), or one 1 oz glass of hard liquor (44 mL). Lifestyle Do not use any products that contain nicotine or tobacco. These products include cigarettes, chewing tobacco, and vaping devices, such as e-cigarettes. If you need help quitting, ask your health care provider. Do not use street drugs. Do not share needles. Ask your health care provider for help if you need support or information about quitting drugs. General instructions Schedule regular health, dental, and eye exams. Stay current with your vaccines. Tell your health care provider if: You often feel depressed. You have ever been abused or do not feel safe at home. Summary Adopting a healthy lifestyle and getting preventive care are important in promoting health and wellness. Follow your health care provider's instructions about healthy diet, exercising, and getting tested or screened for diseases. Follow your health care provider's instructions on monitoring your cholesterol and blood pressure. This information is not intended to replace advice given to you by your health care provider. Make sure you discuss any questions you have with your health care  provider. Document Revised: 03/19/2021 Document Reviewed: 03/19/2021 Elsevier Patient Education  2024 [employer]. "

## 2024-11-22 NOTE — Progress Notes (Signed)
 "   Subjective:    Patient ID: Benjamin Kramer, male    DOB: [birth year], 24 y.o.   MRN: [id number]      HPI Benjamin Kramer is here for a Physical exam and her chronic medical problems.      Medications and allergies reviewed with patient and updated if appropriate.  Medications Ordered Prior to Encounter[1]  Review of Systems     Objective:  There were no vitals filed for this visit. There were no vitals filed for this visit. There is no height or weight on file to calculate BMI.  BP Readings from Last 3 Encounters:  06/19/23 110/60  03/26/23 (!) 146/80  01/19/20 122/84    Wt Readings from Last 3 Encounters:  06/19/23 175 lb (79.4 kg)  01/19/20 187 lb (84.8 kg)  01/30/19 175 lb (79.4 kg)       Physical Exam Constitutional: She appears well-developed and well-nourished. No distress.  HENT:  Head: Normocephalic and atraumatic.  Right Ear: External ear normal. Normal ear canal and TM Left Ear: External ear normal.  Normal ear canal and TM Mouth/Throat: Oropharynx is clear and moist.  Eyes: Conjunctivae normal.  Neck: Neck supple. No tracheal deviation present. No thyromegaly present.  No carotid bruit  Cardiovascular: Normal rate, regular rhythm and normal heart sounds.   No murmur heard.  No edema. Pulmonary/Chest: Effort normal and breath sounds normal. No respiratory distress. She has no wheezes. She has no rales.  Breast: deferred   Abdominal: Soft. She exhibits no distension. There is no tenderness.  Lymphadenopathy: She has no cervical adenopathy.  Skin: Skin is warm and dry. She is not diaphoretic.  Psychiatric: She has a normal mood and affect. Her behavior is normal.     Lab Results  Component Value Date   WBC 2.8 (L) 06/26/2023   HGB 15.7 06/26/2023   HCT 46.6 06/26/2023   PLT 174.0 06/26/2023   GLUCOSE 87 06/19/2023   CHOL 179 06/19/2023   TRIG 51 06/19/2023   HDL 75 06/19/2023   LDLCALC 94 06/19/2023   ALT 16 06/19/2023   AST 20 06/19/2023   NA 144  06/19/2023   K 4.5 06/19/2023   CL 106 06/19/2023   CREATININE 1.15 06/19/2023   BUN 18 06/19/2023   CO2 24 06/19/2023   TSH 1.500 06/19/2023         Assessment & Plan:   Physical exam: Screening blood work  ordered Exercise   Weight   Substance abuse  none   Reviewed recommended immunizations.   Health Maintenance  Topic Date Due   HPV VACCINES (1 - Male 3-dose series) Never done   Hepatitis B Vaccines 19-59 Average Risk (1 of 3 - 19+ 3-dose series) Never done   Influenza Vaccine  Never done   COVID-19 Vaccine (1 - 2025-26 season) Never done   DTaP/Tdap/Td (3 - Td or Tdap) 01/18/2030   Hepatitis C Screening  Completed   HIV Screening  Completed   Pneumococcal Vaccine  Aged Out   Meningococcal B Vaccine  Aged Out          See Problem List for Assessment and Plan of chronic medical problems.         [1] Current Outpatient Medications on File Prior to Visit  Medication Sig Dispense Refill   Ascorbic Acid (VITAMIN C) 500 MG CAPS Take 500 mg by mouth.     cetirizine  (ZYRTEC ) 10 MG tablet Take 1 tablet (10 mg total) by mouth daily. 30 tablet 0  fluticasone  (FLONASE ) 50 MCG/ACT nasal spray Place 2 sprays into both nostrils daily. 1 g 0   ibuprofen  (ADVIL ) 600 MG tablet Take 1 tablet (600 mg total) by mouth every 6 (six) hours as needed. 30 tablet 0   ipratropium (ATROVENT ) 0.06 % nasal spray Place 2 sprays into both nostrils 4 (four) times daily. 15 mL 12   No current facility-administered medications on file prior to visit.  This encounter was created in error - please disregard. "

## 2024-11-23 ENCOUNTER — Encounter: Payer: Self-pay | Admitting: Internal Medicine

## 2024-11-23 DIAGNOSIS — Z0000 Encounter for general adult medical examination without abnormal findings: Secondary | ICD-10-CM
